# Patient Record
Sex: Female | Born: 1935 | Race: White | Hispanic: No | Smoking: Never smoker
Health system: Southern US, Community
[De-identification: ages and names within clinical notes are randomized; demographics above are authoritative.]

## PROBLEM LIST (undated history)

## (undated) DIAGNOSIS — I10 Essential (primary) hypertension: Secondary | ICD-10-CM

## (undated) HISTORY — PX: SHOULDER SURGERY: SHX246

## (undated) HISTORY — DX: Essential (primary) hypertension: I10

---

## 2013-06-14 ENCOUNTER — Ambulatory Visit: Payer: Medicare Other | Attending: Internal Medicine

## 2013-06-14 DIAGNOSIS — M25569 Pain in unspecified knee: Secondary | ICD-10-CM | POA: Insufficient documentation

## 2013-06-14 DIAGNOSIS — M545 Low back pain, unspecified: Secondary | ICD-10-CM | POA: Insufficient documentation

## 2013-06-14 DIAGNOSIS — M6281 Muscle weakness (generalized): Secondary | ICD-10-CM | POA: Insufficient documentation

## 2013-06-14 DIAGNOSIS — IMO0001 Reserved for inherently not codable concepts without codable children: Secondary | ICD-10-CM | POA: Insufficient documentation

## 2013-06-14 DIAGNOSIS — R5381 Other malaise: Secondary | ICD-10-CM | POA: Insufficient documentation

## 2013-06-25 ENCOUNTER — Ambulatory Visit: Payer: Medicare Other | Admitting: Physical Therapy

## 2013-06-28 ENCOUNTER — Ambulatory Visit: Payer: Medicare Other | Admitting: Physical Therapy

## 2013-07-02 ENCOUNTER — Ambulatory Visit: Payer: Medicare Other | Admitting: Physical Therapy

## 2013-07-05 ENCOUNTER — Encounter: Payer: Medicare Other | Admitting: Physical Therapy

## 2013-07-09 ENCOUNTER — Encounter: Payer: Medicare Other | Admitting: Physical Therapy

## 2013-07-12 ENCOUNTER — Ambulatory Visit: Payer: Medicare Other | Attending: Internal Medicine | Admitting: Physical Therapy

## 2013-07-12 DIAGNOSIS — IMO0001 Reserved for inherently not codable concepts without codable children: Secondary | ICD-10-CM | POA: Insufficient documentation

## 2013-07-12 DIAGNOSIS — R5381 Other malaise: Secondary | ICD-10-CM | POA: Insufficient documentation

## 2013-07-12 DIAGNOSIS — M545 Low back pain, unspecified: Secondary | ICD-10-CM | POA: Insufficient documentation

## 2013-07-12 DIAGNOSIS — M6281 Muscle weakness (generalized): Secondary | ICD-10-CM | POA: Insufficient documentation

## 2013-07-12 DIAGNOSIS — M25569 Pain in unspecified knee: Secondary | ICD-10-CM | POA: Insufficient documentation

## 2013-07-16 ENCOUNTER — Ambulatory Visit: Payer: Medicare Other | Admitting: Physical Therapy

## 2013-07-19 ENCOUNTER — Ambulatory Visit: Payer: Medicare Other | Admitting: Physical Therapy

## 2013-07-23 ENCOUNTER — Ambulatory Visit: Payer: Medicare Other

## 2013-07-26 ENCOUNTER — Ambulatory Visit: Payer: Medicare Other | Admitting: Physical Therapy

## 2013-07-30 ENCOUNTER — Ambulatory Visit: Payer: Medicare Other | Admitting: Physical Therapy

## 2013-08-02 ENCOUNTER — Ambulatory Visit: Payer: Medicare Other

## 2013-08-09 ENCOUNTER — Ambulatory Visit: Payer: Medicare Other | Attending: Internal Medicine | Admitting: Physical Therapy

## 2013-08-09 DIAGNOSIS — IMO0001 Reserved for inherently not codable concepts without codable children: Secondary | ICD-10-CM | POA: Insufficient documentation

## 2013-08-09 DIAGNOSIS — R5381 Other malaise: Secondary | ICD-10-CM | POA: Insufficient documentation

## 2013-08-09 DIAGNOSIS — M6281 Muscle weakness (generalized): Secondary | ICD-10-CM | POA: Insufficient documentation

## 2013-08-09 DIAGNOSIS — M25569 Pain in unspecified knee: Secondary | ICD-10-CM | POA: Insufficient documentation

## 2013-08-09 DIAGNOSIS — M545 Low back pain, unspecified: Secondary | ICD-10-CM | POA: Insufficient documentation

## 2013-08-16 ENCOUNTER — Ambulatory Visit: Payer: Medicare Other | Admitting: Physical Therapy

## 2013-08-23 ENCOUNTER — Ambulatory Visit: Payer: Medicare Other | Admitting: Physical Therapy

## 2013-08-30 ENCOUNTER — Ambulatory Visit: Payer: Medicare Other

## 2013-12-26 ENCOUNTER — Ambulatory Visit: Payer: Medicare Other | Attending: Internal Medicine | Admitting: Occupational Therapy

## 2013-12-26 DIAGNOSIS — M25649 Stiffness of unspecified hand, not elsewhere classified: Secondary | ICD-10-CM | POA: Diagnosis not present

## 2013-12-26 DIAGNOSIS — M25549 Pain in joints of unspecified hand: Secondary | ICD-10-CM | POA: Insufficient documentation

## 2013-12-26 DIAGNOSIS — M6281 Muscle weakness (generalized): Secondary | ICD-10-CM | POA: Insufficient documentation

## 2013-12-26 DIAGNOSIS — IMO0001 Reserved for inherently not codable concepts without codable children: Secondary | ICD-10-CM | POA: Insufficient documentation

## 2014-01-28 ENCOUNTER — Ambulatory Visit: Payer: Medicare Other | Attending: Internal Medicine | Admitting: *Deleted

## 2014-01-28 DIAGNOSIS — M6281 Muscle weakness (generalized): Secondary | ICD-10-CM | POA: Insufficient documentation

## 2014-01-28 DIAGNOSIS — M25642 Stiffness of left hand, not elsewhere classified: Secondary | ICD-10-CM | POA: Insufficient documentation

## 2014-01-28 DIAGNOSIS — M06842 Other specified rheumatoid arthritis, left hand: Secondary | ICD-10-CM | POA: Insufficient documentation

## 2014-01-28 DIAGNOSIS — M79642 Pain in left hand: Secondary | ICD-10-CM | POA: Insufficient documentation

## 2014-01-28 DIAGNOSIS — M25641 Stiffness of right hand, not elsewhere classified: Secondary | ICD-10-CM | POA: Insufficient documentation

## 2014-01-28 DIAGNOSIS — M06841 Other specified rheumatoid arthritis, right hand: Secondary | ICD-10-CM | POA: Insufficient documentation

## 2014-01-28 DIAGNOSIS — M79641 Pain in right hand: Secondary | ICD-10-CM | POA: Insufficient documentation

## 2014-01-31 ENCOUNTER — Ambulatory Visit: Payer: Medicare Other | Admitting: Occupational Therapy

## 2014-01-31 DIAGNOSIS — M79642 Pain in left hand: Secondary | ICD-10-CM | POA: Diagnosis not present

## 2014-01-31 DIAGNOSIS — M25642 Stiffness of left hand, not elsewhere classified: Secondary | ICD-10-CM | POA: Diagnosis not present

## 2014-01-31 DIAGNOSIS — M06842 Other specified rheumatoid arthritis, left hand: Secondary | ICD-10-CM | POA: Diagnosis not present

## 2014-01-31 DIAGNOSIS — M25641 Stiffness of right hand, not elsewhere classified: Secondary | ICD-10-CM | POA: Diagnosis not present

## 2014-01-31 DIAGNOSIS — M79641 Pain in right hand: Secondary | ICD-10-CM | POA: Diagnosis not present

## 2014-01-31 DIAGNOSIS — M06841 Other specified rheumatoid arthritis, right hand: Secondary | ICD-10-CM | POA: Diagnosis present

## 2014-01-31 DIAGNOSIS — M6281 Muscle weakness (generalized): Secondary | ICD-10-CM | POA: Diagnosis not present

## 2014-02-04 ENCOUNTER — Encounter: Payer: Self-pay | Admitting: Occupational Therapy

## 2014-02-04 ENCOUNTER — Ambulatory Visit: Payer: Medicare Other | Attending: Internal Medicine | Admitting: Occupational Therapy

## 2014-02-04 DIAGNOSIS — M25642 Stiffness of left hand, not elsewhere classified: Secondary | ICD-10-CM | POA: Insufficient documentation

## 2014-02-04 DIAGNOSIS — M79641 Pain in right hand: Secondary | ICD-10-CM | POA: Insufficient documentation

## 2014-02-04 DIAGNOSIS — M6281 Muscle weakness (generalized): Secondary | ICD-10-CM | POA: Diagnosis not present

## 2014-02-04 DIAGNOSIS — M25641 Stiffness of right hand, not elsewhere classified: Secondary | ICD-10-CM | POA: Diagnosis not present

## 2014-02-04 DIAGNOSIS — M06841 Other specified rheumatoid arthritis, right hand: Secondary | ICD-10-CM | POA: Diagnosis present

## 2014-02-04 DIAGNOSIS — M06842 Other specified rheumatoid arthritis, left hand: Secondary | ICD-10-CM | POA: Diagnosis present

## 2014-02-04 DIAGNOSIS — M25649 Stiffness of unspecified hand, not elsewhere classified: Secondary | ICD-10-CM

## 2014-02-04 DIAGNOSIS — M79642 Pain in left hand: Secondary | ICD-10-CM | POA: Diagnosis not present

## 2014-02-04 NOTE — Therapy (Addendum)
Occupational Therapy Treatment  Patient Details  Name: Tenicia Gural MRN: [id number] Date of Birth: 15-Dec-1930  Encounter Date: 02/04/2014      OT End of Session - 02/04/14 1229    Visit Number 3   Number of Visits 9   Date for OT Re-Evaluation 02/24/14   OT Start Time 1102   OT Stop Time 1204   OT Time Calculation (min) 62 min   Activity Tolerance Patient tolerated treatment well      Past Medical History  Diagnosis Date  . Hypertension     Past Surgical History  Procedure Laterality Date  . Shoulder surgery Bilateral approx 10 years ago    There were no vitals taken for this visit.  Visit Diagnosis:  Pain in joint, hand, left  Pain in joint, hand, right  Stiffness of hand joint, unspecified laterality  Hand muscle weakness  Subjective:  No changes with splints. Pt reports 1/10 pain in bilateral hands (chronic), unknow alleviating factors, aggravating factors:  Use, weather.       OT Treatments/Exercises (OP) - 02/04/14 0700    Exercises Wrist  AROM wrist flexion, extension, RD (from arthritis booklet)   Splinting Pt fitted for size 6 oval 8 splint for L 4th digit due to PIP subluxation.          Education - 02/04/14 1223    Education provided Yes   Education Details AE to assist with pain/increase ease with ADLs (adaptive cutting board, wall/counter-mounted jar opener, rolling trivet, bowl holder)   Education Details Patient   Methods Handout;Explanation   Comprehension Verbalized understanding     Pt instructed in splint wear/care and precautions of oval8 splint for L 4th digit.  Pt instructed to wear during the day with activity as tolerated.  Pt verbalized understanding of education provided.  Reviewed splint wear/care of UD splints and purpose.  Pt returned demo donning/doffing with min cueing.  Verbally reviewed wear time (during the day with activities as tolerated).  Pt verbalized understanding.    Pt instructed in wrist HEP from Arthritis  Booklet (exercises #1-3).  Pt has handout from last session.  Pt verbalized understanding and returned demo.        OT Long Term Goals - 02/04/14 1234    Title Pt will verbalize understanding of activity modifications/AE for ADLs and IADLs prn to decrease pain, protect joints, and increase ease.   Time 3   Period Weeks   Status On-going   Title Pt will be independent with HEP to maintain ROM/strength.   Time 3   Period Weeks   Status On-going   Title Pt will be independent with splint wear/care to improve positioning/pain.   Time 3   Period Weeks   Status On-going          Plan - 02/04/14 1230    Clinical Impression Statement pt is progressing towards goals as she verbalized understanding of current education provided   OT Plan instruct pt in finger exercises from Arthritis Booklet, ?paraffin        Problem List There are no active problems to display for this patient.                                           [facility] 02/04/2014, 12:51 PM

## 2014-02-04 NOTE — Patient Instructions (Addendum)
Please perform the "Wrist Exercises" in your Arthritis Booklet on page 53 (#1-3).    Wear your finger splint as tolerated during the day with activity.  Remove if any discomfort/problems.  Wear your hand/wrist splints as much as you can during the day with activities.

## 2014-02-07 ENCOUNTER — Encounter: Payer: Medicare Other | Admitting: Occupational Therapy

## 2014-02-11 ENCOUNTER — Ambulatory Visit: Payer: Medicare Other | Admitting: Occupational Therapy

## 2014-02-11 ENCOUNTER — Encounter: Payer: Self-pay | Admitting: Occupational Therapy

## 2014-02-11 DIAGNOSIS — M06842 Other specified rheumatoid arthritis, left hand: Secondary | ICD-10-CM | POA: Diagnosis not present

## 2014-02-11 DIAGNOSIS — M79642 Pain in left hand: Secondary | ICD-10-CM

## 2014-02-11 DIAGNOSIS — M79641 Pain in right hand: Secondary | ICD-10-CM

## 2014-02-11 DIAGNOSIS — M6281 Muscle weakness (generalized): Secondary | ICD-10-CM

## 2014-02-11 DIAGNOSIS — M25649 Stiffness of unspecified hand, not elsewhere classified: Secondary | ICD-10-CM

## 2014-02-11 NOTE — Therapy (Addendum)
Occupational Therapy Treatment  Patient Details  Name: Lindsay Barrera MRN: [id number] Date of Birth: May 03, 1930  Encounter Date: 02/11/2014      OT End of Session - 02/11/14 1210    Visit Number 4  4/10-G   Number of Visits 9  wk 3/4   Date for OT Re-Evaluation 02/24/14   Authorization Type Medicare, G code, no FOTO   OT Start Time 1103   OT Stop Time 1154   OT Time Calculation (min) 51 min   Activity Tolerance Patient tolerated treatment well      Past Medical History  Diagnosis Date  . Hypertension     Past Surgical History  Procedure Laterality Date  . Shoulder surgery Bilateral approx 10 years ago    There were no vitals taken for this visit.  Visit Diagnosis:  Pain in joint, hand, left  Pain in joint, hand, right  Stiffness of hand joint, unspecified laterality  Hand muscle weakness   Subjective:  No problems with splints, but difficult to wear both at the same time.  This was a good session.  I like the paraffin.  Pt denies pain.        OT Treatments/Exercises (OP) - 02/11/14 1122    Exercises   Exercises Wrist  Review wrist HEP. Pt verbalized understanding/returned demo.   RUE Paraffin   Number Minutes Paraffin 10 Minutes   RUE Paraffin Location --  bilateral hand/wrist   Comments no adverse reactions for stiffness in prep for exercise   LUE Paraffin   Number Minutes Paraffin 10 Minutes   LUE Paraffin Location --  bilateral hand/wrist   Comments no adverse reactions for stiffness/in prep for exercise   Splinting   Splinting Reviewed UD splint wear/care.  Recommeded that pt alternate splints due to pt reports of diiffulty doing activiity when wearing both at same time.  Pt fitted for L pre-fab CMC splint for increased support at MP/CMC due to MP flex.          Education - 02/11/14 1200    Education provided Yes   Education Details Hand Exercises from Arthritis Booklet, Use of Paraffin Bath at home, Splint wear/care of L thumb pre-fab CMC  splint, how to observe for future difficulties that may indicate need for further therapy, Recommended aquatic exercises for full body exercise/instructed in benefit.   Education Details Patient   Methods Demonstration;Handout;Explanation;Verbal cues  has handout from previous sessions for HEP   Comprehension Verbalized understanding;Returned demonstration;Verbal cues required              Plan - 02/11/14 1211    Clinical Impression Statement Pt progressing towards goals as she verbalized understanding of current education provided   OT Plan review education prn, ? fit pt for R CMC splint if pt feels L is helpful, ? yellow putty HEP        Problem List There are no active problems to display for this patient.                                           [facility] 02/11/2014, 12:21 PM

## 2014-02-11 NOTE — Patient Instructions (Signed)
Add Hand Exercises #1-6 (from Arthritis Booklet page [phone]) to you home exercise program.  Perform 10 of each at least 1-2 times a day even if sore to maintain range of motion.   Continue doing wrist exercises.  We tried Paraffin bath today (hot wax) to help with pain/stiffness.  You can purchase a home unit from a department store or drug store and use up to 2x/day for up to 20 min .

## 2014-02-14 ENCOUNTER — Ambulatory Visit: Payer: Medicare Other | Admitting: Occupational Therapy

## 2014-02-14 ENCOUNTER — Encounter: Payer: Self-pay | Admitting: Occupational Therapy

## 2014-02-14 DIAGNOSIS — M79642 Pain in left hand: Secondary | ICD-10-CM

## 2014-02-14 DIAGNOSIS — M79641 Pain in right hand: Secondary | ICD-10-CM

## 2014-02-14 DIAGNOSIS — M6281 Muscle weakness (generalized): Secondary | ICD-10-CM

## 2014-02-14 DIAGNOSIS — M25649 Stiffness of unspecified hand, not elsewhere classified: Secondary | ICD-10-CM

## 2014-02-14 DIAGNOSIS — M06842 Other specified rheumatoid arthritis, left hand: Secondary | ICD-10-CM | POA: Diagnosis not present

## 2014-02-14 NOTE — Patient Instructions (Addendum)
Grip Strengthening (Resistive Putty)   Rest wrist with little finger side on table so that it blocks ulnar drift.  Squeeze putty using thumb and all fingers. Repeat 10 times. Do 1 sessions every other day.  Copyright  VHI. All rights reserved.  Adduction (Resistive Putty) ___  Press between 2 fingers and pull putty anchored with other hand. Repeat with all fingers. Repeat 3 times between each finger. Do 1 sessions every other day.  Copyright  VHI. All rights reserved.  Extension (Resistive Putty)   Place putty loop around fingers. Stretch loop by opening hand at large knuckles only. Keep thumb still and finger- tips straight. Repeat 5 times. Do 1 sessions every other day.     Then roll out putty into long roll and pinch with each finger and thumb.  Roll out 2 times and pinch with each hand.  1 time every other day.   Copyright  VHI. All rights reserved.

## 2014-02-14 NOTE — Therapy (Signed)
Occupational Therapy Treatment  Patient Details  Name: Lindsay Barrera MRN: [id number] Date of Birth: 1931-01-27  Encounter Date: Mar 14, 2014      OT End of Session - Mar 14, 2014 1407    Visit Number 5   Number of Visits 9   Authorization Type Medicare, G code, no FOTO   OT Start Time 1321   OT Stop Time 1401   OT Time Calculation (min) 40 min   Activity Tolerance Patient tolerated treatment well      Past Medical History  Diagnosis Date  . Hypertension     Past Surgical History  Procedure Laterality Date  . Shoulder surgery Bilateral approx 10 years ago    There were no vitals taken for this visit.  Visit Diagnosis:  Pain in joint, hand, left  Pain in joint, hand, right  Stiffness of hand joint, unspecified laterality  Hand muscle weakness      Subjective Assessment - 03-14-14 1345    Symptoms "This (CMC splint) really helps"   Currently in Pain? Yes   Pain Score 1    Pain Location Hand   Pain Orientation Right;Left   Aggravating Factors  resistive use   Pain Relieving Factors paraffin/heat, rest            OT Treatments/Exercises (OP) - March 14, 2014 0001    Splinting   Splinting Pt fitted for R CMC splint (pre-fab).  Reviewed splint wear/care.  Pt verbalized understanding.          OT Education - 2014-03-14 1351    Education provided Yes   Education Details Yellow putty HEP to maintain strength (pt not to perform in increased pain)   Person(s) Educated Patient   Methods Explanation;Demonstration;Handout   Comprehension Verbalized understanding;Returned demonstration            OT Long Term Goals - 03-14-14 1347    OT LONG TERM GOAL #1   Title Pt will verbalize understanding of activity modifications/AE for ADLs and IADLs prn to decrease pain, protect joints, and increase ease.   Status Achieved   OT LONG TERM GOAL #2   Title Pt will be independent with HEP to maintain ROM/strength.   Status Achieved   OT LONG TERM GOAL #3   Title Pt will be  independent with splint wear/care to improve positioning/pain.   Status Achieved          Plan - 03/14/14 1411    Clinical Impression Statement pt verbalized understanding of all education provided    Plan d/c OT   Consulted and Agree with Plan of Care Patient          G-Codes - 03-14-14 1413    Functional Assessment Tool Used 0-1/10 pain, verbalized understanding of HEP, activity modifications, and splint wear/care   Functional Limitation Self care   Self Care Goal Status ([id number]) At least 20 percent but less than 40 percent impaired, limited or restricted   Self Care Discharge Status [phone]) At least 20 percent but less than 40 percent impaired, limited or restricted      Problem List There are no active problems to display for this patient.        OCCUPATIONAL THERAPY DISCHARGE SUMMARY     Remaining deficits: 1. Pain--improved 2. Decreased strength--addressed with HEP 3. Decreased ROM--addressed with HEP   Education / Equipment: Pt instructed in HEP, splint wear care (after pt fitted for oval 8 splint for R 4th digit PIP subluxation, bilateral UD splints, bilateral CMC splints), activity modifications/AE, joint protection techniques,  arthritis info.  Pt verbalized understanding of all education provided.  Plan: Patient agrees to discharge.  Patient goals were met. Patient is being discharged due to meeting the stated rehab goals.  ?????                                          Vianne Bulls, OTR/L [facility] [address] [place], [place]  [address] Phone:   [phone] Fax:  [place] 02/14/2014, 2:20 PM

## 2014-02-18 ENCOUNTER — Encounter: Payer: Medicare Other | Admitting: Occupational Therapy

## 2014-02-21 ENCOUNTER — Encounter: Payer: Medicare Other | Admitting: Occupational Therapy

## 2014-11-28 ENCOUNTER — Other Ambulatory Visit: Payer: Self-pay | Admitting: Gastroenterology

## 2014-11-28 DIAGNOSIS — K5900 Constipation, unspecified: Secondary | ICD-10-CM

## 2014-11-28 DIAGNOSIS — R194 Change in bowel habit: Secondary | ICD-10-CM

## 2014-12-10 ENCOUNTER — Ambulatory Visit
Admission: RE | Admit: 2014-12-10 | Discharge: 2014-12-10 | Disposition: A | Discharge status: Home / Self Care | Payer: PPO | Source: Ambulatory Visit | Attending: Gastroenterology | Admitting: Gastroenterology

## 2014-12-10 ENCOUNTER — Other Ambulatory Visit: Payer: Medicare Other

## 2014-12-10 DIAGNOSIS — K5900 Constipation, unspecified: Secondary | ICD-10-CM

## 2014-12-10 DIAGNOSIS — R194 Change in bowel habit: Secondary | ICD-10-CM

## 2014-12-10 MED ORDER — GADOBENATE DIMEGLUMINE 529 MG/ML IV SOLN
10.0000 mL | Freq: Once | INTRAVENOUS | Status: AC | PRN
  Administered 2014-12-10: 10 mL via INTRAVENOUS

## 2015-05-27 DIAGNOSIS — W19XXXA Unspecified fall, initial encounter: Secondary | ICD-10-CM | POA: Diagnosis not present

## 2015-05-27 DIAGNOSIS — S20229A Contusion of unspecified back wall of thorax, initial encounter: Secondary | ICD-10-CM | POA: Diagnosis not present

## 2015-05-27 DIAGNOSIS — I4510 Unspecified right bundle-branch block: Secondary | ICD-10-CM | POA: Diagnosis not present

## 2015-05-27 DIAGNOSIS — E559 Vitamin D deficiency, unspecified: Secondary | ICD-10-CM | POA: Diagnosis not present

## 2015-05-27 DIAGNOSIS — M069 Rheumatoid arthritis, unspecified: Secondary | ICD-10-CM | POA: Diagnosis not present

## 2015-05-27 DIAGNOSIS — I10 Essential (primary) hypertension: Secondary | ICD-10-CM | POA: Diagnosis not present

## 2015-05-27 DIAGNOSIS — M810 Age-related osteoporosis without current pathological fracture: Secondary | ICD-10-CM | POA: Diagnosis not present

## 2015-06-05 DIAGNOSIS — M79641 Pain in right hand: Secondary | ICD-10-CM | POA: Diagnosis not present

## 2015-06-05 DIAGNOSIS — M79642 Pain in left hand: Secondary | ICD-10-CM | POA: Diagnosis not present

## 2015-06-05 DIAGNOSIS — G8929 Other chronic pain: Secondary | ICD-10-CM | POA: Diagnosis not present

## 2015-06-05 DIAGNOSIS — M810 Age-related osteoporosis without current pathological fracture: Secondary | ICD-10-CM | POA: Diagnosis not present

## 2015-06-05 DIAGNOSIS — Z79899 Other long term (current) drug therapy: Secondary | ICD-10-CM | POA: Diagnosis not present

## 2015-06-05 DIAGNOSIS — M545 Low back pain: Secondary | ICD-10-CM | POA: Diagnosis not present

## 2015-06-05 DIAGNOSIS — M069 Rheumatoid arthritis, unspecified: Secondary | ICD-10-CM | POA: Diagnosis not present

## 2015-06-13 DIAGNOSIS — M25562 Pain in left knee: Secondary | ICD-10-CM | POA: Diagnosis not present

## 2015-06-13 DIAGNOSIS — M79641 Pain in right hand: Secondary | ICD-10-CM | POA: Diagnosis not present

## 2015-06-13 DIAGNOSIS — M79642 Pain in left hand: Secondary | ICD-10-CM | POA: Diagnosis not present

## 2015-06-13 DIAGNOSIS — M545 Low back pain: Secondary | ICD-10-CM | POA: Diagnosis not present

## 2015-06-24 DIAGNOSIS — M25562 Pain in left knee: Secondary | ICD-10-CM | POA: Diagnosis not present

## 2015-06-24 DIAGNOSIS — M79641 Pain in right hand: Secondary | ICD-10-CM | POA: Diagnosis not present

## 2015-06-24 DIAGNOSIS — M545 Low back pain: Secondary | ICD-10-CM | POA: Diagnosis not present

## 2015-06-24 DIAGNOSIS — M79642 Pain in left hand: Secondary | ICD-10-CM | POA: Diagnosis not present

## 2015-06-26 DIAGNOSIS — M79642 Pain in left hand: Secondary | ICD-10-CM | POA: Diagnosis not present

## 2015-06-26 DIAGNOSIS — M545 Low back pain: Secondary | ICD-10-CM | POA: Diagnosis not present

## 2015-06-26 DIAGNOSIS — M79641 Pain in right hand: Secondary | ICD-10-CM | POA: Diagnosis not present

## 2015-06-26 DIAGNOSIS — M25562 Pain in left knee: Secondary | ICD-10-CM | POA: Diagnosis not present

## 2015-07-01 DIAGNOSIS — M545 Low back pain: Secondary | ICD-10-CM | POA: Diagnosis not present

## 2015-07-01 DIAGNOSIS — M25562 Pain in left knee: Secondary | ICD-10-CM | POA: Diagnosis not present

## 2015-07-01 DIAGNOSIS — M79641 Pain in right hand: Secondary | ICD-10-CM | POA: Diagnosis not present

## 2015-07-01 DIAGNOSIS — M79642 Pain in left hand: Secondary | ICD-10-CM | POA: Diagnosis not present

## 2015-07-03 DIAGNOSIS — M79641 Pain in right hand: Secondary | ICD-10-CM | POA: Diagnosis not present

## 2015-07-03 DIAGNOSIS — M545 Low back pain: Secondary | ICD-10-CM | POA: Diagnosis not present

## 2015-07-03 DIAGNOSIS — M79642 Pain in left hand: Secondary | ICD-10-CM | POA: Diagnosis not present

## 2015-07-03 DIAGNOSIS — M25562 Pain in left knee: Secondary | ICD-10-CM | POA: Diagnosis not present

## 2015-07-08 DIAGNOSIS — M545 Low back pain: Secondary | ICD-10-CM | POA: Diagnosis not present

## 2015-07-08 DIAGNOSIS — M79641 Pain in right hand: Secondary | ICD-10-CM | POA: Diagnosis not present

## 2015-07-08 DIAGNOSIS — M79642 Pain in left hand: Secondary | ICD-10-CM | POA: Diagnosis not present

## 2015-07-08 DIAGNOSIS — M25562 Pain in left knee: Secondary | ICD-10-CM | POA: Diagnosis not present

## 2015-07-10 DIAGNOSIS — M79641 Pain in right hand: Secondary | ICD-10-CM | POA: Diagnosis not present

## 2015-07-10 DIAGNOSIS — M25562 Pain in left knee: Secondary | ICD-10-CM | POA: Diagnosis not present

## 2015-07-10 DIAGNOSIS — M79642 Pain in left hand: Secondary | ICD-10-CM | POA: Diagnosis not present

## 2015-07-10 DIAGNOSIS — M545 Low back pain: Secondary | ICD-10-CM | POA: Diagnosis not present

## 2015-08-05 DIAGNOSIS — M069 Rheumatoid arthritis, unspecified: Secondary | ICD-10-CM | POA: Diagnosis not present

## 2015-08-05 DIAGNOSIS — M25562 Pain in left knee: Secondary | ICD-10-CM | POA: Diagnosis not present

## 2015-08-05 DIAGNOSIS — M810 Age-related osteoporosis without current pathological fracture: Secondary | ICD-10-CM | POA: Diagnosis not present

## 2015-08-05 DIAGNOSIS — Z79899 Other long term (current) drug therapy: Secondary | ICD-10-CM | POA: Diagnosis not present

## 2015-08-05 DIAGNOSIS — M545 Low back pain: Secondary | ICD-10-CM | POA: Diagnosis not present

## 2015-08-05 DIAGNOSIS — G8929 Other chronic pain: Secondary | ICD-10-CM | POA: Diagnosis not present

## 2015-08-07 DIAGNOSIS — E559 Vitamin D deficiency, unspecified: Secondary | ICD-10-CM | POA: Diagnosis not present

## 2015-08-07 DIAGNOSIS — R5381 Other malaise: Secondary | ICD-10-CM | POA: Diagnosis not present

## 2015-08-07 DIAGNOSIS — M069 Rheumatoid arthritis, unspecified: Secondary | ICD-10-CM | POA: Diagnosis not present

## 2015-08-07 DIAGNOSIS — I4891 Unspecified atrial fibrillation: Secondary | ICD-10-CM | POA: Diagnosis not present

## 2015-08-07 DIAGNOSIS — I4510 Unspecified right bundle-branch block: Secondary | ICD-10-CM | POA: Diagnosis not present

## 2015-08-07 DIAGNOSIS — I10 Essential (primary) hypertension: Secondary | ICD-10-CM | POA: Diagnosis not present

## 2015-08-07 DIAGNOSIS — M810 Age-related osteoporosis without current pathological fracture: Secondary | ICD-10-CM | POA: Diagnosis not present

## 2015-08-07 DIAGNOSIS — R000 Tachycardia, unspecified: Secondary | ICD-10-CM | POA: Diagnosis not present

## 2015-08-07 DIAGNOSIS — Z0000 Encounter for general adult medical examination without abnormal findings: Secondary | ICD-10-CM | POA: Diagnosis not present

## 2015-08-07 DIAGNOSIS — R5383 Other fatigue: Secondary | ICD-10-CM | POA: Diagnosis not present

## 2015-08-08 DIAGNOSIS — I10 Essential (primary) hypertension: Secondary | ICD-10-CM | POA: Diagnosis not present

## 2015-08-08 DIAGNOSIS — R9431 Abnormal electrocardiogram [ECG] [EKG]: Secondary | ICD-10-CM | POA: Diagnosis not present

## 2015-08-08 DIAGNOSIS — I4891 Unspecified atrial fibrillation: Secondary | ICD-10-CM | POA: Diagnosis not present

## 2015-08-08 DIAGNOSIS — R0602 Shortness of breath: Secondary | ICD-10-CM | POA: Diagnosis not present

## 2015-08-20 DIAGNOSIS — R9431 Abnormal electrocardiogram [ECG] [EKG]: Secondary | ICD-10-CM | POA: Diagnosis not present

## 2015-08-20 DIAGNOSIS — R0602 Shortness of breath: Secondary | ICD-10-CM | POA: Diagnosis not present

## 2015-08-27 DIAGNOSIS — R5383 Other fatigue: Secondary | ICD-10-CM | POA: Diagnosis not present

## 2015-08-27 DIAGNOSIS — I4891 Unspecified atrial fibrillation: Secondary | ICD-10-CM | POA: Diagnosis not present

## 2015-08-27 DIAGNOSIS — I10 Essential (primary) hypertension: Secondary | ICD-10-CM | POA: Diagnosis not present

## 2015-08-27 DIAGNOSIS — R5381 Other malaise: Secondary | ICD-10-CM | POA: Diagnosis not present

## 2015-09-25 DIAGNOSIS — I480 Paroxysmal atrial fibrillation: Secondary | ICD-10-CM | POA: Diagnosis not present

## 2015-09-25 DIAGNOSIS — M069 Rheumatoid arthritis, unspecified: Secondary | ICD-10-CM | POA: Diagnosis not present

## 2015-09-25 DIAGNOSIS — E559 Vitamin D deficiency, unspecified: Secondary | ICD-10-CM | POA: Diagnosis not present

## 2015-09-25 DIAGNOSIS — Z8781 Personal history of (healed) traumatic fracture: Secondary | ICD-10-CM | POA: Diagnosis not present

## 2015-09-25 DIAGNOSIS — M810 Age-related osteoporosis without current pathological fracture: Secondary | ICD-10-CM | POA: Diagnosis not present

## 2015-09-25 DIAGNOSIS — I10 Essential (primary) hypertension: Secondary | ICD-10-CM | POA: Diagnosis not present

## 2015-10-07 DIAGNOSIS — M5432 Sciatica, left side: Secondary | ICD-10-CM | POA: Diagnosis not present

## 2015-10-08 DIAGNOSIS — M5432 Sciatica, left side: Secondary | ICD-10-CM | POA: Diagnosis not present

## 2015-11-12 DIAGNOSIS — G8929 Other chronic pain: Secondary | ICD-10-CM | POA: Diagnosis not present

## 2015-11-12 DIAGNOSIS — M25562 Pain in left knee: Secondary | ICD-10-CM | POA: Diagnosis not present

## 2015-11-12 DIAGNOSIS — M810 Age-related osteoporosis without current pathological fracture: Secondary | ICD-10-CM | POA: Diagnosis not present

## 2015-11-12 DIAGNOSIS — M069 Rheumatoid arthritis, unspecified: Secondary | ICD-10-CM | POA: Diagnosis not present

## 2015-11-12 DIAGNOSIS — Z79899 Other long term (current) drug therapy: Secondary | ICD-10-CM | POA: Diagnosis not present

## 2015-11-12 DIAGNOSIS — M545 Low back pain: Secondary | ICD-10-CM | POA: Diagnosis not present

## 2015-11-12 DIAGNOSIS — I4891 Unspecified atrial fibrillation: Secondary | ICD-10-CM | POA: Diagnosis not present

## 2015-12-30 DIAGNOSIS — I4510 Unspecified right bundle-branch block: Secondary | ICD-10-CM | POA: Diagnosis not present

## 2015-12-30 DIAGNOSIS — E7800 Pure hypercholesterolemia, unspecified: Secondary | ICD-10-CM | POA: Diagnosis not present

## 2015-12-30 DIAGNOSIS — Z8781 Personal history of (healed) traumatic fracture: Secondary | ICD-10-CM | POA: Diagnosis not present

## 2015-12-30 DIAGNOSIS — I10 Essential (primary) hypertension: Secondary | ICD-10-CM | POA: Diagnosis not present

## 2015-12-30 DIAGNOSIS — E559 Vitamin D deficiency, unspecified: Secondary | ICD-10-CM | POA: Diagnosis not present

## 2015-12-30 DIAGNOSIS — M810 Age-related osteoporosis without current pathological fracture: Secondary | ICD-10-CM | POA: Diagnosis not present

## 2015-12-30 DIAGNOSIS — I480 Paroxysmal atrial fibrillation: Secondary | ICD-10-CM | POA: Diagnosis not present

## 2015-12-30 DIAGNOSIS — M069 Rheumatoid arthritis, unspecified: Secondary | ICD-10-CM | POA: Diagnosis not present

## 2015-12-31 DIAGNOSIS — Z8781 Personal history of (healed) traumatic fracture: Secondary | ICD-10-CM | POA: Diagnosis not present

## 2015-12-31 DIAGNOSIS — E559 Vitamin D deficiency, unspecified: Secondary | ICD-10-CM | POA: Diagnosis not present

## 2015-12-31 DIAGNOSIS — M069 Rheumatoid arthritis, unspecified: Secondary | ICD-10-CM | POA: Diagnosis not present

## 2015-12-31 DIAGNOSIS — I10 Essential (primary) hypertension: Secondary | ICD-10-CM | POA: Diagnosis not present

## 2015-12-31 DIAGNOSIS — E7800 Pure hypercholesterolemia, unspecified: Secondary | ICD-10-CM | POA: Diagnosis not present

## 2015-12-31 DIAGNOSIS — I480 Paroxysmal atrial fibrillation: Secondary | ICD-10-CM | POA: Diagnosis not present

## 2015-12-31 DIAGNOSIS — I4510 Unspecified right bundle-branch block: Secondary | ICD-10-CM | POA: Diagnosis not present

## 2015-12-31 DIAGNOSIS — M810 Age-related osteoporosis without current pathological fracture: Secondary | ICD-10-CM | POA: Diagnosis not present

## 2016-03-01 DIAGNOSIS — I480 Paroxysmal atrial fibrillation: Secondary | ICD-10-CM | POA: Diagnosis not present

## 2016-03-01 DIAGNOSIS — I10 Essential (primary) hypertension: Secondary | ICD-10-CM | POA: Diagnosis not present

## 2016-03-11 DIAGNOSIS — I10 Essential (primary) hypertension: Secondary | ICD-10-CM | POA: Diagnosis not present

## 2016-03-31 DIAGNOSIS — J069 Acute upper respiratory infection, unspecified: Secondary | ICD-10-CM | POA: Diagnosis not present

## 2016-04-01 DIAGNOSIS — I4891 Unspecified atrial fibrillation: Secondary | ICD-10-CM | POA: Diagnosis not present

## 2016-04-01 DIAGNOSIS — I10 Essential (primary) hypertension: Secondary | ICD-10-CM | POA: Diagnosis not present

## 2016-04-28 DIAGNOSIS — I10 Essential (primary) hypertension: Secondary | ICD-10-CM | POA: Diagnosis not present

## 2016-04-28 DIAGNOSIS — I4891 Unspecified atrial fibrillation: Secondary | ICD-10-CM | POA: Diagnosis not present

## 2016-08-05 DIAGNOSIS — I4891 Unspecified atrial fibrillation: Secondary | ICD-10-CM | POA: Diagnosis not present

## 2016-08-05 DIAGNOSIS — I10 Essential (primary) hypertension: Secondary | ICD-10-CM | POA: Diagnosis not present

## 2016-09-09 DIAGNOSIS — I480 Paroxysmal atrial fibrillation: Secondary | ICD-10-CM | POA: Diagnosis not present

## 2016-09-09 DIAGNOSIS — M069 Rheumatoid arthritis, unspecified: Secondary | ICD-10-CM | POA: Diagnosis not present

## 2016-09-09 DIAGNOSIS — E7800 Pure hypercholesterolemia, unspecified: Secondary | ICD-10-CM | POA: Diagnosis not present

## 2016-09-09 DIAGNOSIS — M810 Age-related osteoporosis without current pathological fracture: Secondary | ICD-10-CM | POA: Diagnosis not present

## 2016-09-09 DIAGNOSIS — E559 Vitamin D deficiency, unspecified: Secondary | ICD-10-CM | POA: Diagnosis not present

## 2016-09-09 DIAGNOSIS — I10 Essential (primary) hypertension: Secondary | ICD-10-CM | POA: Diagnosis not present

## 2016-09-16 DIAGNOSIS — I4891 Unspecified atrial fibrillation: Secondary | ICD-10-CM | POA: Diagnosis not present

## 2016-09-16 DIAGNOSIS — R0789 Other chest pain: Secondary | ICD-10-CM | POA: Diagnosis not present

## 2016-09-16 DIAGNOSIS — I10 Essential (primary) hypertension: Secondary | ICD-10-CM | POA: Diagnosis not present

## 2016-10-14 DIAGNOSIS — S8992XA Unspecified injury of left lower leg, initial encounter: Secondary | ICD-10-CM | POA: Diagnosis not present

## 2016-10-14 DIAGNOSIS — M25562 Pain in left knee: Secondary | ICD-10-CM | POA: Diagnosis not present

## 2016-10-18 IMAGING — MR MR ABDOMEN WO/W CM
15 of 25 series · 23 of 48 positions shown · IV contrast (multihance)
Comparison: No priors.

CLINICAL DATA: 83-year-old female with constipation and abdominal
and pelvic pain and pressure for 1 year.

EXAM:
MRI ABDOMEN AND PELVIS WITHOUT AND WITH CONTRAST
TECHNIQUE: Multiplanar multisequence MR imaging of the abdomen and pelvis was
performed both before and after the administration of intravenous
contrast.
CONTRAST:  10mL MULTIHANCE GADOBENATE DIMEGLUMINE 529 MG/ML IV SOLN

[Series 3: t2_tse_sag · sagittal · 4.5mm · 0.44mm/px · 2 of 36 slices shown]
[im 1/36]
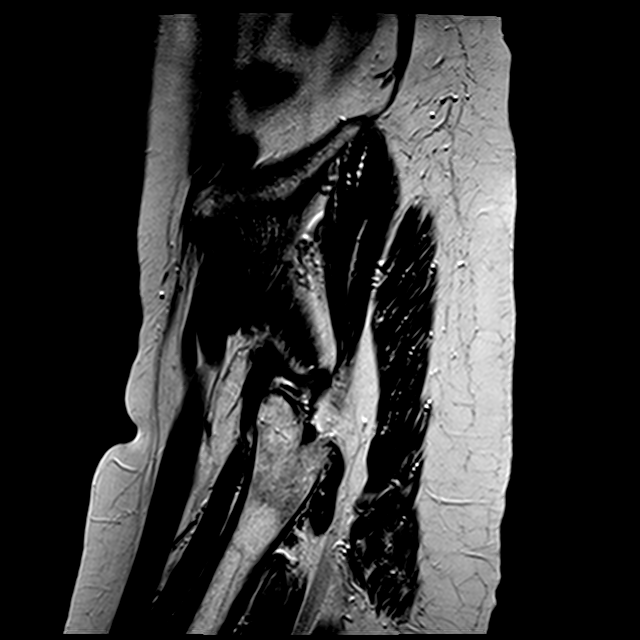
[im 36/36]
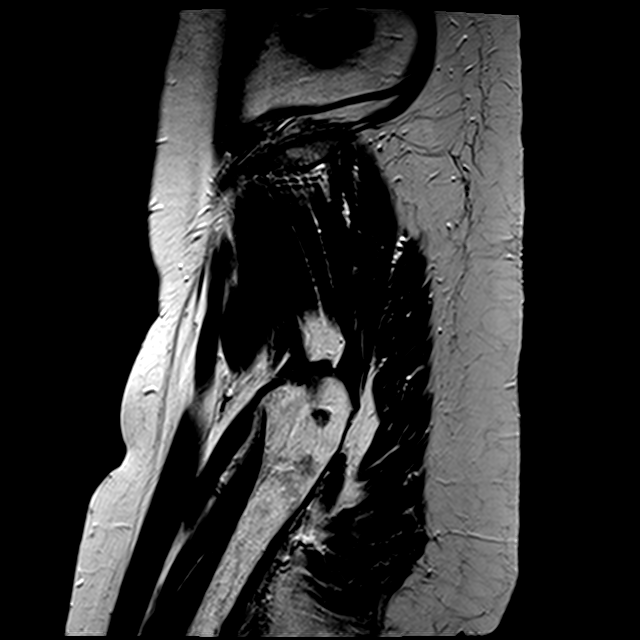

[Series 4: cor haste · coronal · 4.5mm · 0.74mm/px · 1 of 33 slices shown]
[im 1/33]
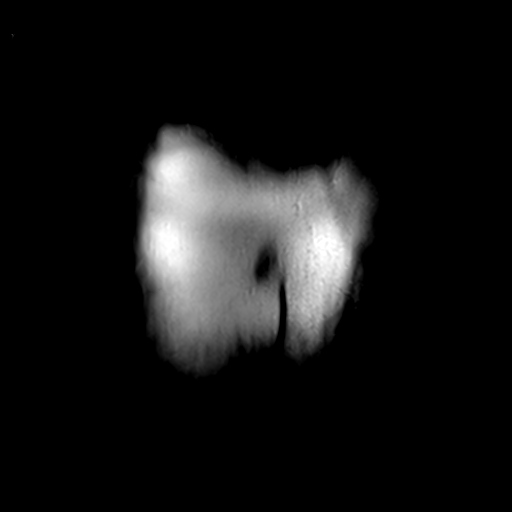

[Series 5: T2 · axial · 6.5mm · 0.53mm/px · 1 of 35 slices shown (1 of 2)]
[im 1/35]
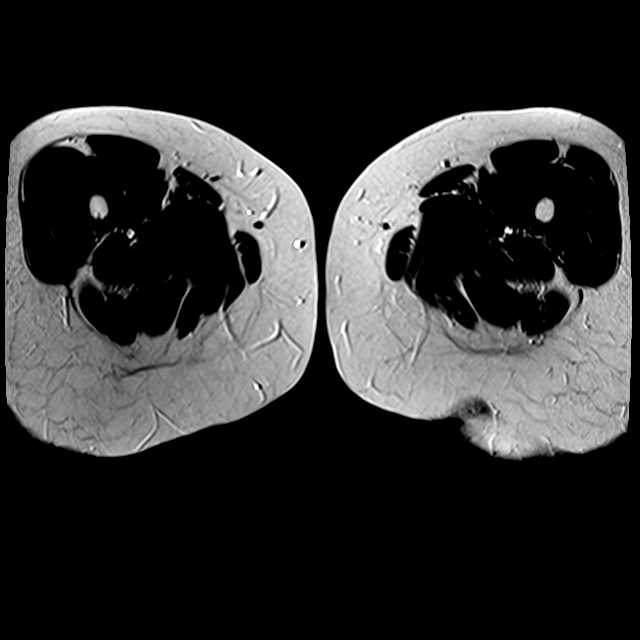

[Series 6: T1 fat-sat · axial · 6.5mm · 0.53mm/px · 1 of 36 slices shown]
[im 1/36]
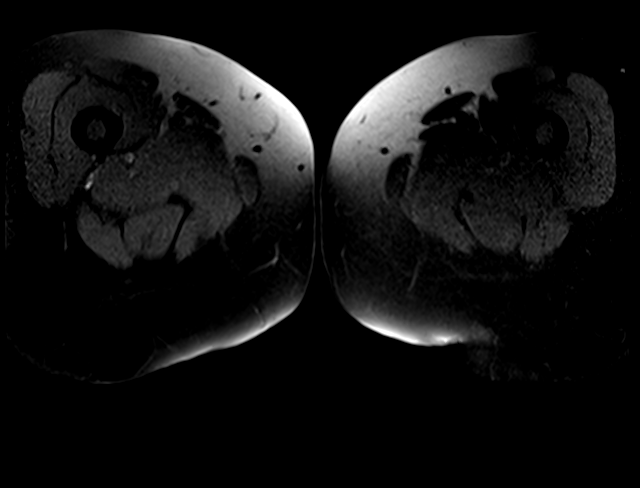

[Series 7: T1 · axial · 6.5mm · 0.53mm/px · 1 of 36 slices shown (1 of 2)]
[im 1/36]
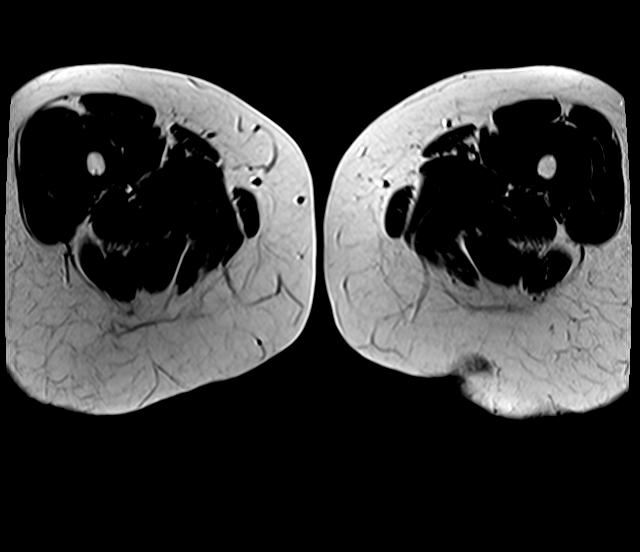

[Series 9: T1 dynamic · sagittal · non-contrast · 5.0mm · 0.62mm/px · 1 of 52 slices shown (1 of 2)]
[im 1/52]
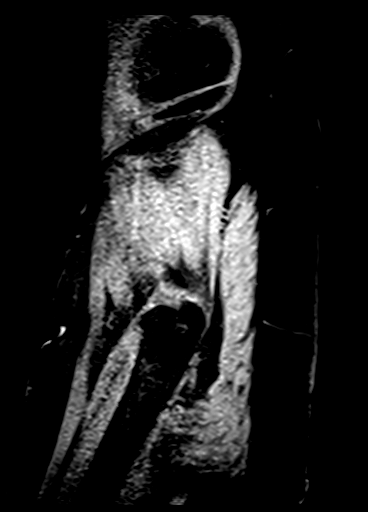

[Series 12: cor haste-abd · coronal · 4.5mm · 0.74mm/px · 1 of 33 slices shown]
[im 1/33]
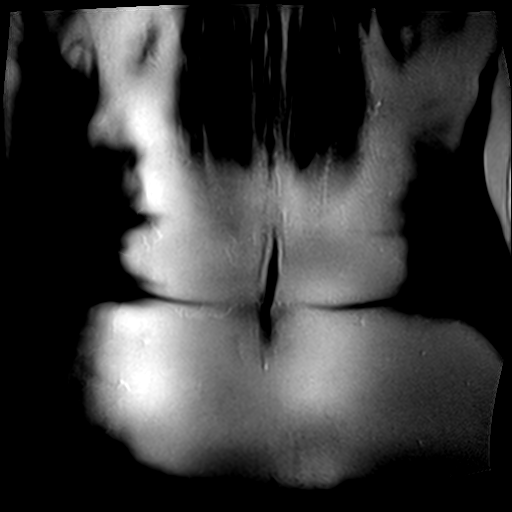

[Series 13: bSSFP · axial · 4.0mm · 0.74mm/px · 1 of 52 slices shown]
[im 1/52]
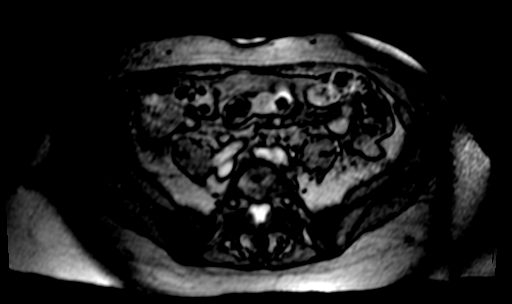

[Series 14: T2 · axial · 5.0mm · 0.59mm/px · 1 of 40 slices shown (2 of 2)]
[im 1/40]
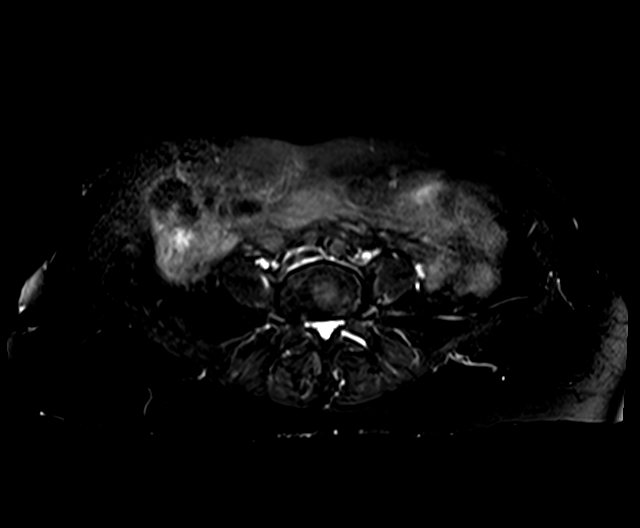

[Series 15: ep2d_[id number]_[id number]_bh · axial · 5.0mm · 1.98mm/px · z∈[+100,+334]mm · 3 of 120 slices shown]
[im 1/120]
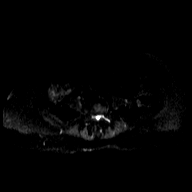
[im 60/120]
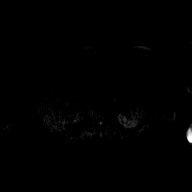
[im 120/120]
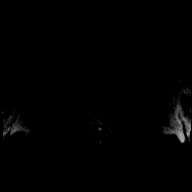

[Series 16: ep2d_[id number]_p2_bh_adc · axial · 5.0mm · 1.98mm/px · 1 of 40 slices shown]
[im 1/40]
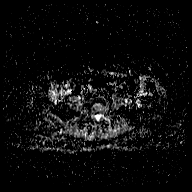

[Series 17: T1 · axial · 5.0mm · 0.74mm/px · z∈[+74,+294]mm · 2 of 90 slices shown (2 of 2)]
[im 1/90]
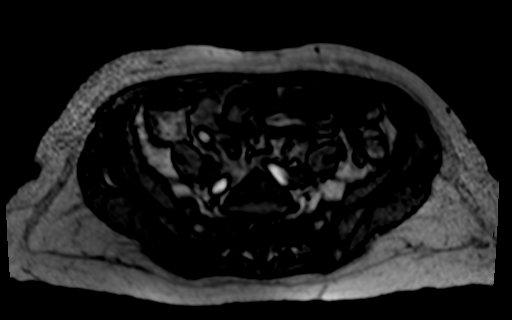
[im 90/90]
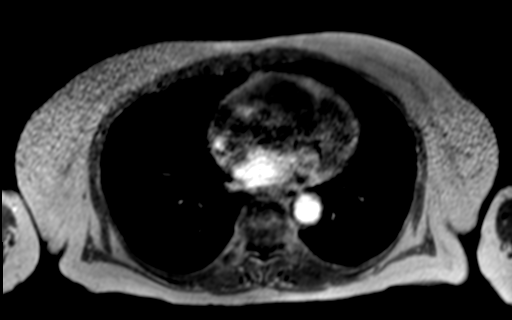

[Series 18: axial haste-abd · axial · 5.0mm · 0.74mm/px · 1 of 40 slices shown]
[im 1/40]
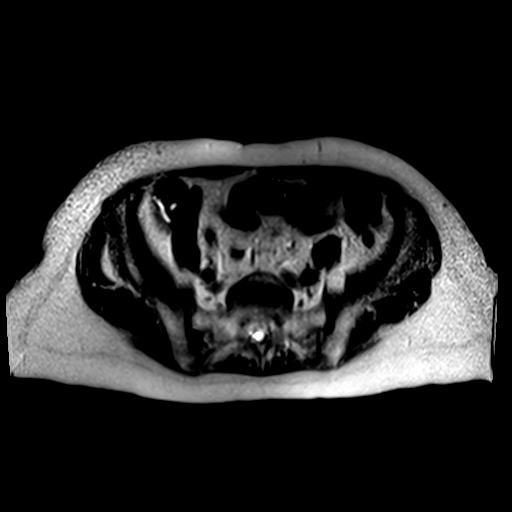

[Series 19: T1 dynamic · axial · non-contrast · 2.3mm · 0.78mm/px · z∈[+52,+289]mm · 3 of 104 slices shown (2 of 2)]
[im 1/104]
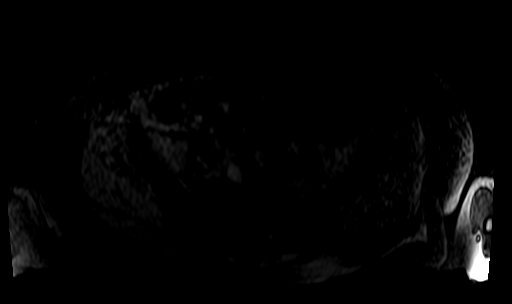
[im 52/104]
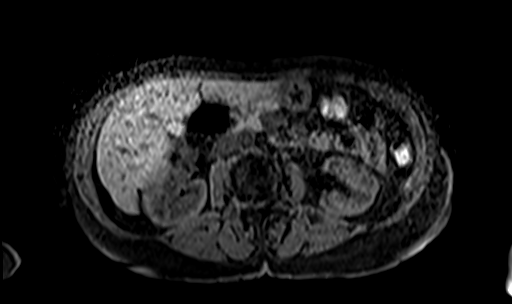
[im 104/104]
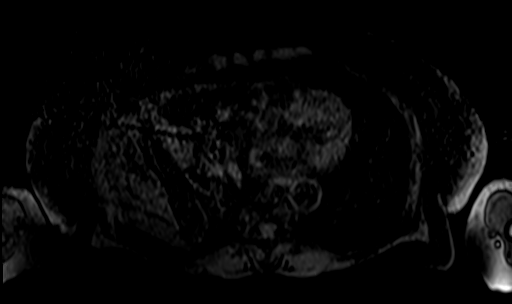

[Series 20: T1 dynamic post-contrast · axial · 2.3mm · 0.78mm/px · z∈[+52,+289]mm · 3 of 104 slices shown]
[im 1/104]
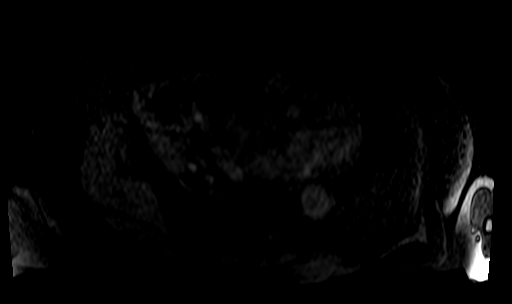
[im 52/104]
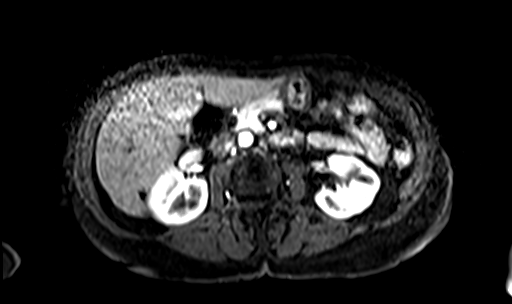
[im 104/104]
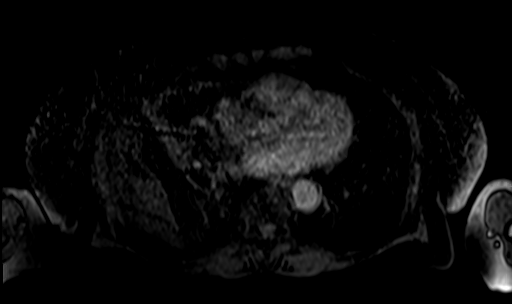

[23 of 48 positions shown; findings below may reference images not displayed]

FINDINGS: Creatinine were obtained on site at [HOSPITAL] at

[HOSPITAL].

Results: Creatinine 1.0 mg/dL.  GFR = 53.

MRI ABDOMEN FINDINGS

Lower chest:  Unremarkable.

Hepatobiliary: No cystic or solid hepatic lesions. No intra or
extrahepatic biliary ductal dilatation. Tiny filling defects lying
dependently in the gallbladder, compatible with gallstones. No
findings to suggest an acute cholecystitis at this time.

Pancreas: No pancreatic mass. No pancreatic ductal dilatation. No
pancreatic or peripancreatic fluid or inflammatory changes.

Spleen: Unremarkable.

Adrenals/Urinary Tract: Several tiny T2 hyperintense, T1
hypointense, nonenhancing lesions are noted in the kidneys
bilaterally, compatible with tiny cysts. In addition, in the lower
pole of the left kidney there is a 13 mm lesion that is slightly T1
hyperintense, T2 hypointense, and does not enhance (image 64 of
series 25), compatible with a proteinaceous/hemorrhagic cyst. No
hydroureteronephrosis.

Stomach/Bowel: Unremarkable.

Vascular/Lymphatic: No aneurysm identified in the abdominal
vasculature. No lymphadenopathy noted in the abdomen.

Other: No significant volume of ascites in the peritoneal cavity.

Musculoskeletal: No aggressive osseous lesions noted in the
visualized portions of the skeleton.

MRI PELVIS FINDINGS

Urinary Tract: Urinary bladder is normal in appearance.

Bowel: Numerous colonic diverticulae are noted in the sigmoid colon.
No overt surrounding inflammatory changes identified to suggest an
acute diverticulitis at this time.

Vascular/Lymphatic: No aneurysm identified in the pelvic
vasculature.

Reproductive: Uterus is atrophic. 1.5 cm signal void in the right
side of the uterine body likely represents a calcified fibroid.
Ovaries are atrophic.

Other: No significant volume of ascites.

Musculoskeletal: No aggressive osseous lesions noted in the
visualized portions of the skeleton.
IMPRESSION: 1. No acute findings in the abdomen or pelvis to account for the
patient's symptoms.
2. Colonic diverticulosis, most pronounced in the sigmoid colon,
without definite findings to suggest acute diverticulitis at this
time.
3. Cholelithiasis without evidence of acute cholecystitis at this
time.
4. Small proteinaceous/hemorrhagic cyst in the lower pole of the
left kidney. Multiple other sub cm simple cysts in the kidneys
bilaterally.
5. Additional incidental findings, as above.

## 2016-10-22 DIAGNOSIS — M25562 Pain in left knee: Secondary | ICD-10-CM | POA: Diagnosis not present

## 2016-10-29 DIAGNOSIS — M069 Rheumatoid arthritis, unspecified: Secondary | ICD-10-CM | POA: Diagnosis not present

## 2016-10-29 DIAGNOSIS — M810 Age-related osteoporosis without current pathological fracture: Secondary | ICD-10-CM | POA: Diagnosis not present

## 2016-10-29 DIAGNOSIS — S8992XA Unspecified injury of left lower leg, initial encounter: Secondary | ICD-10-CM | POA: Diagnosis not present

## 2016-10-29 DIAGNOSIS — I83893 Varicose veins of bilateral lower extremities with other complications: Secondary | ICD-10-CM | POA: Diagnosis not present

## 2016-10-29 DIAGNOSIS — I480 Paroxysmal atrial fibrillation: Secondary | ICD-10-CM | POA: Diagnosis not present

## 2016-10-29 DIAGNOSIS — E559 Vitamin D deficiency, unspecified: Secondary | ICD-10-CM | POA: Diagnosis not present

## 2016-11-03 DIAGNOSIS — I10 Essential (primary) hypertension: Secondary | ICD-10-CM | POA: Diagnosis not present

## 2016-11-03 DIAGNOSIS — R29898 Other symptoms and signs involving the musculoskeletal system: Secondary | ICD-10-CM | POA: Diagnosis not present

## 2016-11-03 DIAGNOSIS — I4891 Unspecified atrial fibrillation: Secondary | ICD-10-CM | POA: Diagnosis not present

## 2016-11-03 DIAGNOSIS — R0789 Other chest pain: Secondary | ICD-10-CM | POA: Diagnosis not present

## 2016-11-23 DIAGNOSIS — I739 Peripheral vascular disease, unspecified: Secondary | ICD-10-CM | POA: Diagnosis not present

## 2016-11-23 DIAGNOSIS — R29898 Other symptoms and signs involving the musculoskeletal system: Secondary | ICD-10-CM | POA: Diagnosis not present

## 2016-11-30 DIAGNOSIS — I739 Peripheral vascular disease, unspecified: Secondary | ICD-10-CM | POA: Diagnosis not present

## 2016-11-30 DIAGNOSIS — I4891 Unspecified atrial fibrillation: Secondary | ICD-10-CM | POA: Diagnosis not present

## 2016-11-30 DIAGNOSIS — R29898 Other symptoms and signs involving the musculoskeletal system: Secondary | ICD-10-CM | POA: Diagnosis not present

## 2016-11-30 DIAGNOSIS — R0789 Other chest pain: Secondary | ICD-10-CM | POA: Diagnosis not present

## 2017-02-02 DIAGNOSIS — I739 Peripheral vascular disease, unspecified: Secondary | ICD-10-CM | POA: Diagnosis not present

## 2017-02-07 DIAGNOSIS — I739 Peripheral vascular disease, unspecified: Secondary | ICD-10-CM | POA: Diagnosis not present

## 2017-02-07 DIAGNOSIS — I4891 Unspecified atrial fibrillation: Secondary | ICD-10-CM | POA: Diagnosis not present

## 2017-02-07 DIAGNOSIS — R29898 Other symptoms and signs involving the musculoskeletal system: Secondary | ICD-10-CM | POA: Diagnosis not present

## 2017-02-07 DIAGNOSIS — R0789 Other chest pain: Secondary | ICD-10-CM | POA: Diagnosis not present

## 2017-04-20 DIAGNOSIS — S30861A Insect bite (nonvenomous) of abdominal wall, initial encounter: Secondary | ICD-10-CM | POA: Diagnosis not present

## 2017-04-20 DIAGNOSIS — S80862A Insect bite (nonvenomous), left lower leg, initial encounter: Secondary | ICD-10-CM | POA: Diagnosis not present

## 2017-04-20 DIAGNOSIS — W57XXXA Bitten or stung by nonvenomous insect and other nonvenomous arthropods, initial encounter: Secondary | ICD-10-CM | POA: Diagnosis not present

## 2017-04-20 DIAGNOSIS — S80861A Insect bite (nonvenomous), right lower leg, initial encounter: Secondary | ICD-10-CM | POA: Diagnosis not present

## 2017-05-05 DIAGNOSIS — R29898 Other symptoms and signs involving the musculoskeletal system: Secondary | ICD-10-CM | POA: Diagnosis not present

## 2017-05-05 DIAGNOSIS — I739 Peripheral vascular disease, unspecified: Secondary | ICD-10-CM | POA: Diagnosis not present

## 2017-05-05 DIAGNOSIS — I4891 Unspecified atrial fibrillation: Secondary | ICD-10-CM | POA: Diagnosis not present

## 2017-06-30 DIAGNOSIS — E7800 Pure hypercholesterolemia, unspecified: Secondary | ICD-10-CM | POA: Diagnosis not present

## 2017-06-30 DIAGNOSIS — I83893 Varicose veins of bilateral lower extremities with other complications: Secondary | ICD-10-CM | POA: Diagnosis not present

## 2017-06-30 DIAGNOSIS — E559 Vitamin D deficiency, unspecified: Secondary | ICD-10-CM | POA: Diagnosis not present

## 2017-06-30 DIAGNOSIS — M069 Rheumatoid arthritis, unspecified: Secondary | ICD-10-CM | POA: Diagnosis not present

## 2017-06-30 DIAGNOSIS — M810 Age-related osteoporosis without current pathological fracture: Secondary | ICD-10-CM | POA: Diagnosis not present

## 2017-06-30 DIAGNOSIS — I480 Paroxysmal atrial fibrillation: Secondary | ICD-10-CM | POA: Diagnosis not present

## 2017-07-28 DIAGNOSIS — I480 Paroxysmal atrial fibrillation: Secondary | ICD-10-CM | POA: Diagnosis not present

## 2017-07-28 DIAGNOSIS — M069 Rheumatoid arthritis, unspecified: Secondary | ICD-10-CM | POA: Diagnosis not present

## 2017-07-28 DIAGNOSIS — E559 Vitamin D deficiency, unspecified: Secondary | ICD-10-CM | POA: Diagnosis not present

## 2017-07-28 DIAGNOSIS — I10 Essential (primary) hypertension: Secondary | ICD-10-CM | POA: Diagnosis not present

## 2017-07-28 DIAGNOSIS — I83893 Varicose veins of bilateral lower extremities with other complications: Secondary | ICD-10-CM | POA: Diagnosis not present

## 2017-07-28 DIAGNOSIS — M810 Age-related osteoporosis without current pathological fracture: Secondary | ICD-10-CM | POA: Diagnosis not present

## 2017-08-11 DIAGNOSIS — E559 Vitamin D deficiency, unspecified: Secondary | ICD-10-CM | POA: Diagnosis not present

## 2017-08-11 DIAGNOSIS — I83893 Varicose veins of bilateral lower extremities with other complications: Secondary | ICD-10-CM | POA: Diagnosis not present

## 2017-08-11 DIAGNOSIS — M069 Rheumatoid arthritis, unspecified: Secondary | ICD-10-CM | POA: Diagnosis not present

## 2017-08-11 DIAGNOSIS — I480 Paroxysmal atrial fibrillation: Secondary | ICD-10-CM | POA: Diagnosis not present

## 2017-08-11 DIAGNOSIS — I10 Essential (primary) hypertension: Secondary | ICD-10-CM | POA: Diagnosis not present

## 2017-08-11 DIAGNOSIS — M810 Age-related osteoporosis without current pathological fracture: Secondary | ICD-10-CM | POA: Diagnosis not present

## 2017-10-13 DIAGNOSIS — M069 Rheumatoid arthritis, unspecified: Secondary | ICD-10-CM | POA: Diagnosis not present

## 2017-10-13 DIAGNOSIS — I480 Paroxysmal atrial fibrillation: Secondary | ICD-10-CM | POA: Diagnosis not present

## 2017-10-13 DIAGNOSIS — I10 Essential (primary) hypertension: Secondary | ICD-10-CM | POA: Diagnosis not present

## 2017-10-13 DIAGNOSIS — E559 Vitamin D deficiency, unspecified: Secondary | ICD-10-CM | POA: Diagnosis not present

## 2017-10-13 DIAGNOSIS — I83893 Varicose veins of bilateral lower extremities with other complications: Secondary | ICD-10-CM | POA: Diagnosis not present

## 2017-10-13 DIAGNOSIS — M810 Age-related osteoporosis without current pathological fracture: Secondary | ICD-10-CM | POA: Diagnosis not present

## 2017-11-15 DIAGNOSIS — I83893 Varicose veins of bilateral lower extremities with other complications: Secondary | ICD-10-CM | POA: Diagnosis not present

## 2017-11-15 DIAGNOSIS — M810 Age-related osteoporosis without current pathological fracture: Secondary | ICD-10-CM | POA: Diagnosis not present

## 2017-11-15 DIAGNOSIS — M069 Rheumatoid arthritis, unspecified: Secondary | ICD-10-CM | POA: Diagnosis not present

## 2017-11-15 DIAGNOSIS — F4321 Adjustment disorder with depressed mood: Secondary | ICD-10-CM | POA: Diagnosis not present

## 2017-11-15 DIAGNOSIS — I10 Essential (primary) hypertension: Secondary | ICD-10-CM | POA: Diagnosis not present

## 2017-11-15 DIAGNOSIS — E559 Vitamin D deficiency, unspecified: Secondary | ICD-10-CM | POA: Diagnosis not present

## 2017-11-15 DIAGNOSIS — I480 Paroxysmal atrial fibrillation: Secondary | ICD-10-CM | POA: Diagnosis not present

## 2017-12-13 DIAGNOSIS — E559 Vitamin D deficiency, unspecified: Secondary | ICD-10-CM | POA: Diagnosis not present

## 2017-12-13 DIAGNOSIS — M810 Age-related osteoporosis without current pathological fracture: Secondary | ICD-10-CM | POA: Diagnosis not present

## 2017-12-13 DIAGNOSIS — M069 Rheumatoid arthritis, unspecified: Secondary | ICD-10-CM | POA: Diagnosis not present

## 2017-12-13 DIAGNOSIS — I10 Essential (primary) hypertension: Secondary | ICD-10-CM | POA: Diagnosis not present

## 2017-12-13 DIAGNOSIS — I480 Paroxysmal atrial fibrillation: Secondary | ICD-10-CM | POA: Diagnosis not present

## 2018-02-23 DIAGNOSIS — R0602 Shortness of breath: Secondary | ICD-10-CM | POA: Diagnosis not present

## 2018-02-23 DIAGNOSIS — I480 Paroxysmal atrial fibrillation: Secondary | ICD-10-CM | POA: Diagnosis not present

## 2018-02-23 DIAGNOSIS — R413 Other amnesia: Secondary | ICD-10-CM | POA: Diagnosis not present

## 2018-02-23 DIAGNOSIS — M810 Age-related osteoporosis without current pathological fracture: Secondary | ICD-10-CM | POA: Diagnosis not present

## 2018-02-23 DIAGNOSIS — E559 Vitamin D deficiency, unspecified: Secondary | ICD-10-CM | POA: Diagnosis not present

## 2018-02-23 DIAGNOSIS — I10 Essential (primary) hypertension: Secondary | ICD-10-CM | POA: Diagnosis not present

## 2018-02-23 DIAGNOSIS — M069 Rheumatoid arthritis, unspecified: Secondary | ICD-10-CM | POA: Diagnosis not present

## 2018-02-28 DIAGNOSIS — I10 Essential (primary) hypertension: Secondary | ICD-10-CM | POA: Diagnosis not present

## 2018-02-28 DIAGNOSIS — I480 Paroxysmal atrial fibrillation: Secondary | ICD-10-CM | POA: Diagnosis not present

## 2018-02-28 DIAGNOSIS — E559 Vitamin D deficiency, unspecified: Secondary | ICD-10-CM | POA: Diagnosis not present

## 2018-02-28 DIAGNOSIS — M069 Rheumatoid arthritis, unspecified: Secondary | ICD-10-CM | POA: Diagnosis not present

## 2018-02-28 DIAGNOSIS — R413 Other amnesia: Secondary | ICD-10-CM | POA: Diagnosis not present

## 2018-02-28 DIAGNOSIS — M810 Age-related osteoporosis without current pathological fracture: Secondary | ICD-10-CM | POA: Diagnosis not present

## 2018-02-28 DIAGNOSIS — R0602 Shortness of breath: Secondary | ICD-10-CM | POA: Diagnosis not present

## 2018-04-03 DIAGNOSIS — M810 Age-related osteoporosis without current pathological fracture: Secondary | ICD-10-CM | POA: Diagnosis not present

## 2018-04-03 DIAGNOSIS — I480 Paroxysmal atrial fibrillation: Secondary | ICD-10-CM | POA: Diagnosis not present

## 2018-04-03 DIAGNOSIS — I10 Essential (primary) hypertension: Secondary | ICD-10-CM | POA: Diagnosis not present

## 2018-04-03 DIAGNOSIS — E559 Vitamin D deficiency, unspecified: Secondary | ICD-10-CM | POA: Diagnosis not present

## 2018-04-03 DIAGNOSIS — R413 Other amnesia: Secondary | ICD-10-CM | POA: Diagnosis not present

## 2018-04-03 DIAGNOSIS — M069 Rheumatoid arthritis, unspecified: Secondary | ICD-10-CM | POA: Diagnosis not present

## 2018-04-27 ENCOUNTER — Ambulatory Visit
Admission: RE | Admit: 2018-04-27 | Discharge: 2018-04-27 | Disposition: A | Discharge status: Home / Self Care | Payer: PPO | Source: Ambulatory Visit | Attending: Family Medicine | Admitting: Family Medicine

## 2018-04-27 ENCOUNTER — Other Ambulatory Visit: Payer: Self-pay | Admitting: Family Medicine

## 2018-04-27 DIAGNOSIS — R52 Pain, unspecified: Secondary | ICD-10-CM

## 2018-04-27 DIAGNOSIS — M25511 Pain in right shoulder: Secondary | ICD-10-CM | POA: Diagnosis not present

## 2018-04-27 DIAGNOSIS — S42294A Other nondisplaced fracture of upper end of right humerus, initial encounter for closed fracture: Secondary | ICD-10-CM | POA: Diagnosis not present

## 2018-04-27 DIAGNOSIS — S42291A Other displaced fracture of upper end of right humerus, initial encounter for closed fracture: Secondary | ICD-10-CM | POA: Diagnosis not present

## 2018-05-12 DIAGNOSIS — E559 Vitamin D deficiency, unspecified: Secondary | ICD-10-CM | POA: Diagnosis not present

## 2018-05-12 DIAGNOSIS — R413 Other amnesia: Secondary | ICD-10-CM | POA: Diagnosis not present

## 2018-05-12 DIAGNOSIS — M810 Age-related osteoporosis without current pathological fracture: Secondary | ICD-10-CM | POA: Diagnosis not present

## 2018-05-12 DIAGNOSIS — I10 Essential (primary) hypertension: Secondary | ICD-10-CM | POA: Diagnosis not present

## 2018-05-12 DIAGNOSIS — M069 Rheumatoid arthritis, unspecified: Secondary | ICD-10-CM | POA: Diagnosis not present

## 2018-05-12 DIAGNOSIS — I480 Paroxysmal atrial fibrillation: Secondary | ICD-10-CM | POA: Diagnosis not present

## 2018-05-12 DIAGNOSIS — S42294D Other nondisplaced fracture of upper end of right humerus, subsequent encounter for fracture with routine healing: Secondary | ICD-10-CM | POA: Diagnosis not present

## 2018-05-25 DIAGNOSIS — S42294D Other nondisplaced fracture of upper end of right humerus, subsequent encounter for fracture with routine healing: Secondary | ICD-10-CM | POA: Diagnosis not present

## 2018-07-03 DIAGNOSIS — S42294D Other nondisplaced fracture of upper end of right humerus, subsequent encounter for fracture with routine healing: Secondary | ICD-10-CM | POA: Diagnosis not present

## 2018-07-19 DIAGNOSIS — M069 Rheumatoid arthritis, unspecified: Secondary | ICD-10-CM | POA: Diagnosis not present

## 2018-07-19 DIAGNOSIS — I10 Essential (primary) hypertension: Secondary | ICD-10-CM | POA: Diagnosis not present

## 2018-07-19 DIAGNOSIS — I480 Paroxysmal atrial fibrillation: Secondary | ICD-10-CM | POA: Diagnosis not present

## 2018-07-19 DIAGNOSIS — I4891 Unspecified atrial fibrillation: Secondary | ICD-10-CM | POA: Diagnosis not present

## 2018-07-19 DIAGNOSIS — M810 Age-related osteoporosis without current pathological fracture: Secondary | ICD-10-CM | POA: Diagnosis not present

## 2018-07-19 DIAGNOSIS — F4321 Adjustment disorder with depressed mood: Secondary | ICD-10-CM | POA: Diagnosis not present

## 2019-01-26 DIAGNOSIS — I4510 Unspecified right bundle-branch block: Secondary | ICD-10-CM | POA: Diagnosis not present

## 2019-01-26 DIAGNOSIS — R413 Other amnesia: Secondary | ICD-10-CM | POA: Diagnosis not present

## 2019-01-26 DIAGNOSIS — E559 Vitamin D deficiency, unspecified: Secondary | ICD-10-CM | POA: Diagnosis not present

## 2019-01-26 DIAGNOSIS — I10 Essential (primary) hypertension: Secondary | ICD-10-CM | POA: Diagnosis not present

## 2019-01-26 DIAGNOSIS — I480 Paroxysmal atrial fibrillation: Secondary | ICD-10-CM | POA: Diagnosis not present

## 2019-01-26 DIAGNOSIS — M069 Rheumatoid arthritis, unspecified: Secondary | ICD-10-CM | POA: Diagnosis not present

## 2019-01-26 DIAGNOSIS — M810 Age-related osteoporosis without current pathological fracture: Secondary | ICD-10-CM | POA: Diagnosis not present

## 2019-05-04 DIAGNOSIS — I480 Paroxysmal atrial fibrillation: Secondary | ICD-10-CM | POA: Diagnosis not present

## 2019-05-04 DIAGNOSIS — F4321 Adjustment disorder with depressed mood: Secondary | ICD-10-CM | POA: Diagnosis not present

## 2019-05-04 DIAGNOSIS — M069 Rheumatoid arthritis, unspecified: Secondary | ICD-10-CM | POA: Diagnosis not present

## 2019-05-04 DIAGNOSIS — M810 Age-related osteoporosis without current pathological fracture: Secondary | ICD-10-CM | POA: Diagnosis not present

## 2019-05-04 DIAGNOSIS — I10 Essential (primary) hypertension: Secondary | ICD-10-CM | POA: Diagnosis not present

## 2019-05-04 DIAGNOSIS — E7800 Pure hypercholesterolemia, unspecified: Secondary | ICD-10-CM | POA: Diagnosis not present

## 2019-05-04 DIAGNOSIS — I4891 Unspecified atrial fibrillation: Secondary | ICD-10-CM | POA: Diagnosis not present

## 2019-06-29 ENCOUNTER — Ambulatory Visit: Payer: PPO | Attending: Internal Medicine

## 2019-06-29 DIAGNOSIS — Z23 Encounter for immunization: Secondary | ICD-10-CM

## 2019-06-29 NOTE — Progress Notes (Signed)
   Covid-19 Vaccination Clinic  Name:  Lindsay Barrera    MRN: [id number] DOB: March 05, 1931  06/29/2019  [name] was observed post Covid-19 immunization for 15 minutes without incident. She was provided with Vaccine Information Sheet and instruction to access the V-Safe system.   [name] was instructed to call 911 with any severe reactions post vaccine: Marland Kitchen Difficulty breathing  . Swelling of face and throat  . A fast heartbeat  . A bad rash all over body  . Dizziness and weakness   Immunizations Administered    Name Date Dose VIS Date Route   Pfizer COVID-19 Vaccine 06/29/2019 11:04 AM 0.3 mL 03/16/2019 Intramuscular   Manufacturer: [employer], [employer]   Lot: [id number]   NDC: [address]-1

## 2019-07-24 ENCOUNTER — Ambulatory Visit: Payer: PPO | Attending: Internal Medicine

## 2019-07-24 DIAGNOSIS — Z23 Encounter for immunization: Secondary | ICD-10-CM

## 2019-07-24 NOTE — Progress Notes (Signed)
   Covid-19 Vaccination Clinic  Name:  Lindsay Barrera    MRN: [id number] DOB: [birth year]  07/24/2019  [name] was observed post Covid-19 immunization for 15 minutes without incident. She was provided with Vaccine Information Sheet and instruction to access the V-Safe system.   [name] was instructed to call 911 with any severe reactions post vaccine: Marland Kitchen Difficulty breathing  . Swelling of face and throat  . A fast heartbeat  . A bad rash all over body  . Dizziness and weakness   Immunizations Administered    Name Date Dose VIS Date Route   Pfizer COVID-19 Vaccine 07/24/2019  1:17 PM 0.3 mL 05/30/2018 Intramuscular   Manufacturer: [employer], [employer]   Lot: [id number]   NDC: [address]-2

## 2019-08-03 DIAGNOSIS — I480 Paroxysmal atrial fibrillation: Secondary | ICD-10-CM | POA: Diagnosis not present

## 2019-08-03 DIAGNOSIS — I4891 Unspecified atrial fibrillation: Secondary | ICD-10-CM | POA: Diagnosis not present

## 2019-08-03 DIAGNOSIS — F4321 Adjustment disorder with depressed mood: Secondary | ICD-10-CM | POA: Diagnosis not present

## 2019-08-03 DIAGNOSIS — M069 Rheumatoid arthritis, unspecified: Secondary | ICD-10-CM | POA: Diagnosis not present

## 2019-08-03 DIAGNOSIS — I10 Essential (primary) hypertension: Secondary | ICD-10-CM | POA: Diagnosis not present

## 2019-08-03 DIAGNOSIS — M810 Age-related osteoporosis without current pathological fracture: Secondary | ICD-10-CM | POA: Diagnosis not present

## 2019-08-03 DIAGNOSIS — E7800 Pure hypercholesterolemia, unspecified: Secondary | ICD-10-CM | POA: Diagnosis not present

## 2019-10-15 DIAGNOSIS — F4321 Adjustment disorder with depressed mood: Secondary | ICD-10-CM | POA: Diagnosis not present

## 2019-10-15 DIAGNOSIS — I480 Paroxysmal atrial fibrillation: Secondary | ICD-10-CM | POA: Diagnosis not present

## 2019-10-15 DIAGNOSIS — M069 Rheumatoid arthritis, unspecified: Secondary | ICD-10-CM | POA: Diagnosis not present

## 2019-10-15 DIAGNOSIS — M810 Age-related osteoporosis without current pathological fracture: Secondary | ICD-10-CM | POA: Diagnosis not present

## 2019-10-15 DIAGNOSIS — I10 Essential (primary) hypertension: Secondary | ICD-10-CM | POA: Diagnosis not present

## 2019-10-15 DIAGNOSIS — Z79899 Other long term (current) drug therapy: Secondary | ICD-10-CM | POA: Diagnosis not present

## 2019-10-15 DIAGNOSIS — R4189 Other symptoms and signs involving cognitive functions and awareness: Secondary | ICD-10-CM | POA: Diagnosis not present

## 2019-10-15 DIAGNOSIS — E7800 Pure hypercholesterolemia, unspecified: Secondary | ICD-10-CM | POA: Diagnosis not present

## 2019-11-29 DIAGNOSIS — M545 Low back pain: Secondary | ICD-10-CM | POA: Diagnosis not present

## 2020-03-05 IMAGING — CR DG SHOULDER 2+V*R*
3 series · 3 of 3 positions shown · non-contrast
Comparison: None.

CLINICAL DATA: Fall this morning.

EXAM:
RIGHT SHOULDER - 2+ VIEW

[w shoulder grashey right]
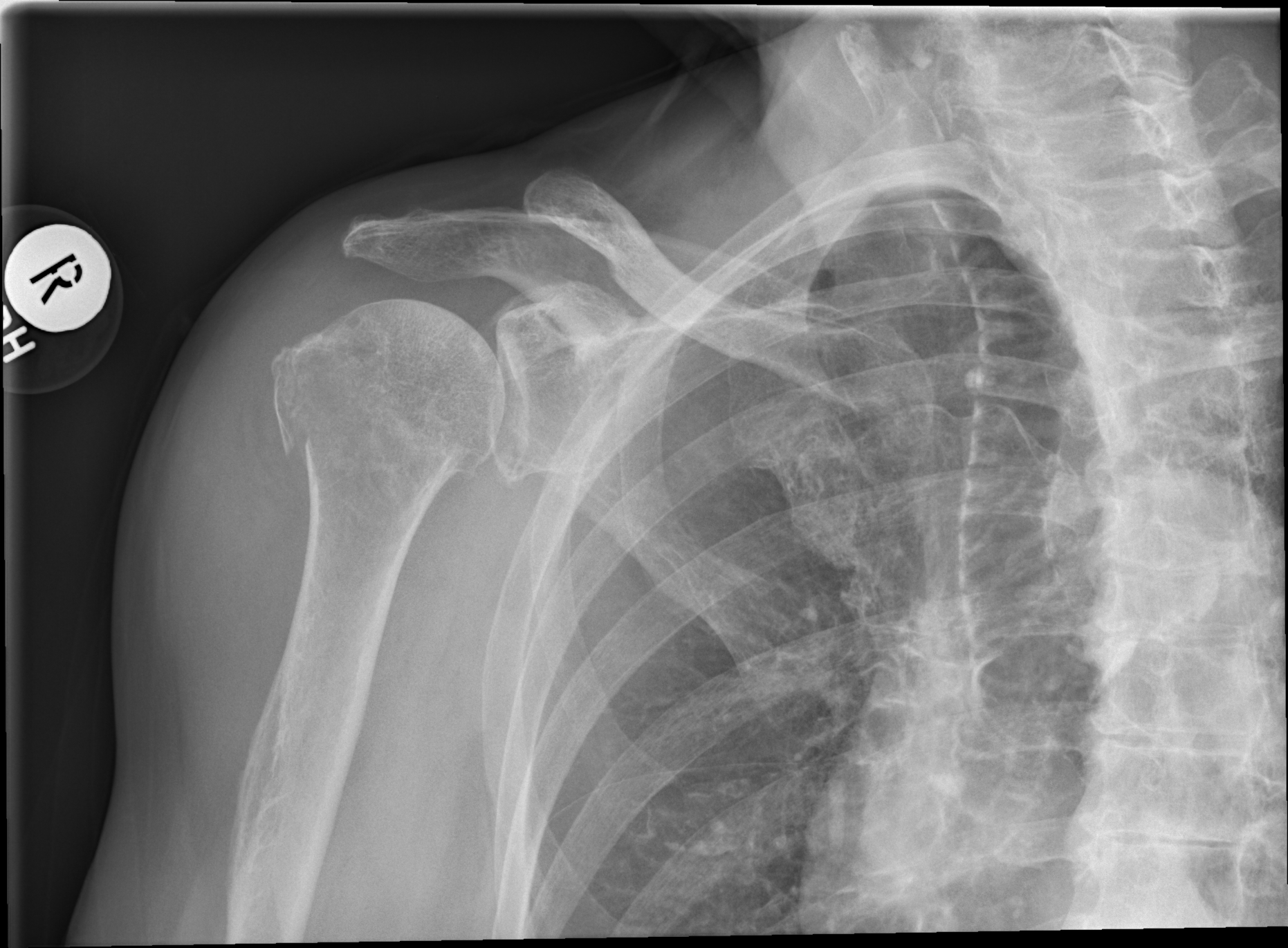

[w shoulder y-view right]
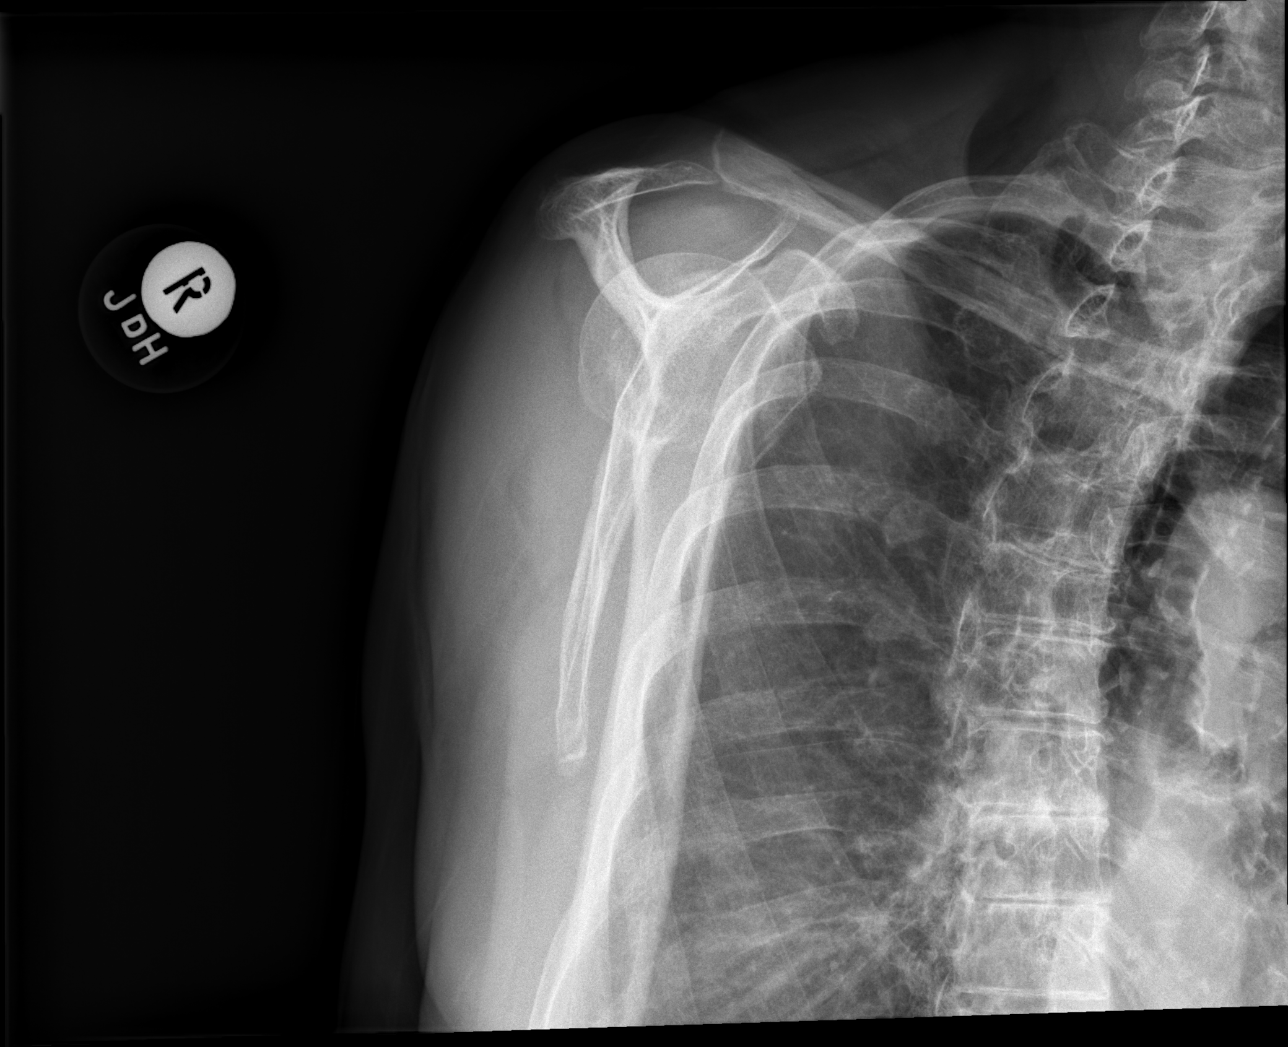

[w trans-thoracic humerus]
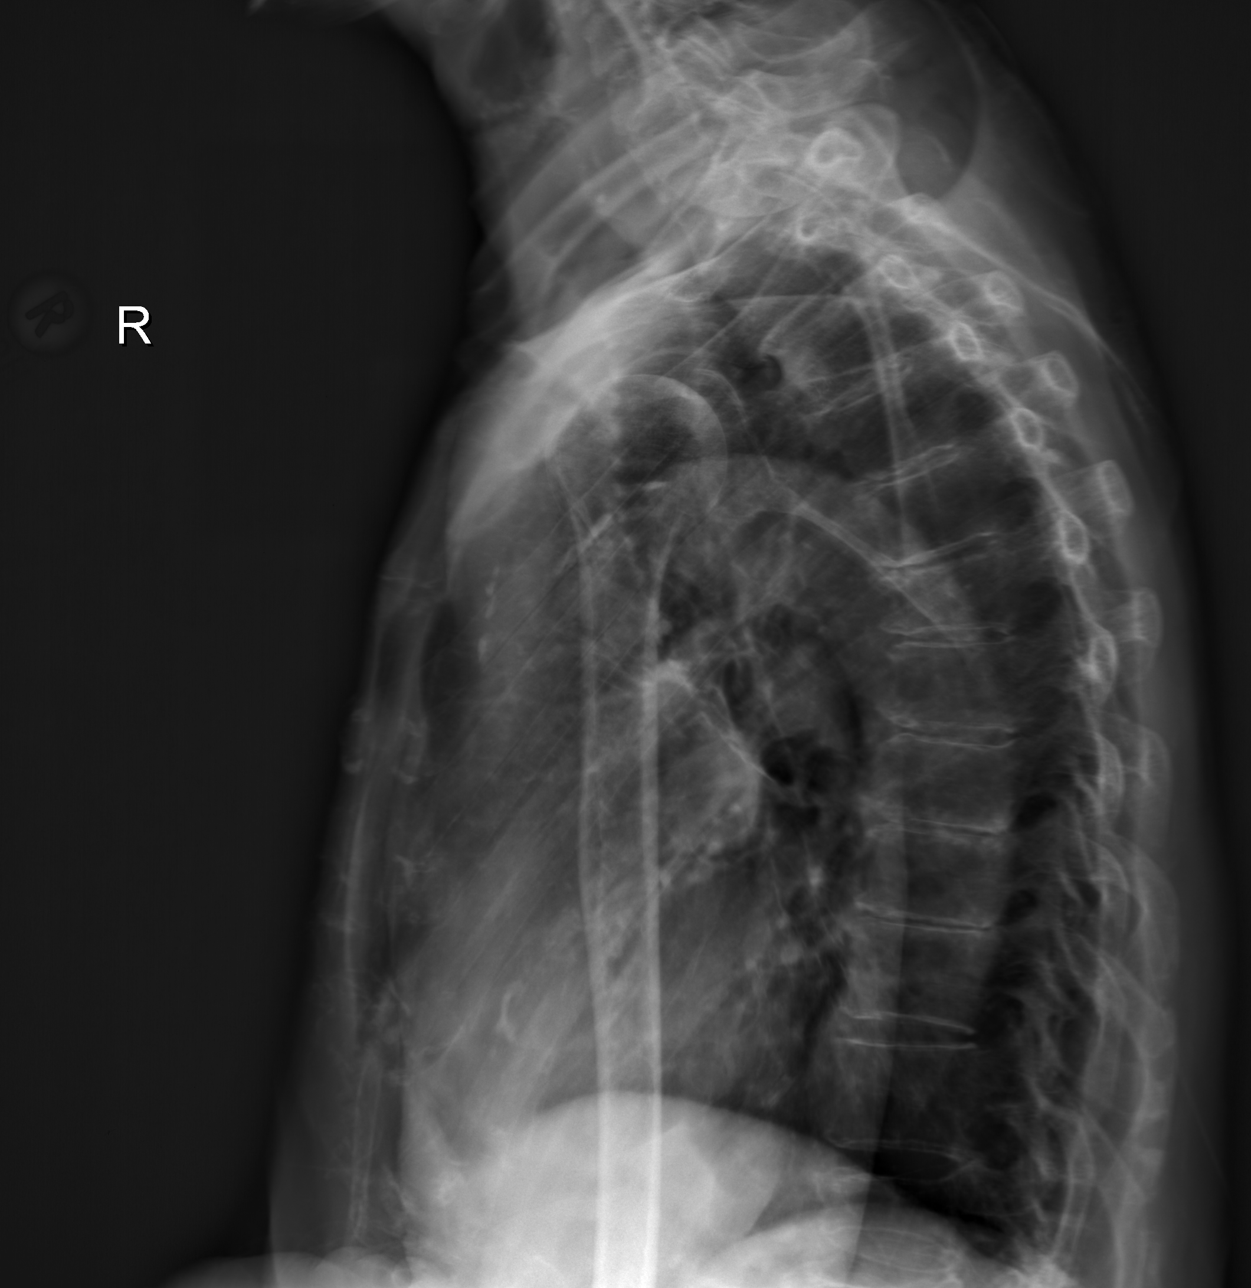

[3 of 3 positions shown; findings below may reference images not displayed]

FINDINGS: Subtle transverse proximal humeral neck fracture without measurable
displacement. There is also involvement of the greater tuberosity
where there is 3 mm of offset. Located glenohumeral joint. Prior
distal clavicle resection.
IMPRESSION: Acute proximal humerus fracture with nondisplaced humeral neck
involvement and greater tuberosity involvement with 3 mm of cortical
offset.

## 2022-03-05 DEATH — deceased
